# Patient Record
Sex: Female | Born: 1945 | Race: Black or African American | Hispanic: No | Smoking: Never smoker
Health system: Southern US, Community
[De-identification: ages and names within clinical notes are randomized; demographics above are authoritative.]

## PROBLEM LIST (undated history)

## (undated) DIAGNOSIS — N3281 Overactive bladder: Secondary | ICD-10-CM

## (undated) DIAGNOSIS — K219 Gastro-esophageal reflux disease without esophagitis: Secondary | ICD-10-CM

## (undated) DIAGNOSIS — M1990 Unspecified osteoarthritis, unspecified site: Secondary | ICD-10-CM

## (undated) DIAGNOSIS — R42 Dizziness and giddiness: Secondary | ICD-10-CM

## (undated) DIAGNOSIS — I10 Essential (primary) hypertension: Secondary | ICD-10-CM

## (undated) DIAGNOSIS — C801 Malignant (primary) neoplasm, unspecified: Secondary | ICD-10-CM

## (undated) DIAGNOSIS — T7840XA Allergy, unspecified, initial encounter: Secondary | ICD-10-CM

## (undated) HISTORY — DX: Malignant (primary) neoplasm, unspecified: C80.1

## (undated) HISTORY — PX: NEPHRECTOMY: SHX65

## (undated) HISTORY — PX: HAMMER TOE SURGERY: SHX385

## (undated) HISTORY — PX: ABDOMINAL HYSTERECTOMY: SHX81

## (undated) HISTORY — DX: Allergy, unspecified, initial encounter: T78.40XA

## (undated) HISTORY — PX: REPLACEMENT TOTAL KNEE: SUR1224

## (undated) HISTORY — DX: Gastro-esophageal reflux disease without esophagitis: K21.9

## (undated) HISTORY — DX: Overactive bladder: N32.81

## (undated) HISTORY — PX: HEMORRHOIDECTOMY WITH HEMORRHOID BANDING: SHX5633

## (undated) HISTORY — DX: Unspecified osteoarthritis, unspecified site: M19.90

## (undated) HISTORY — DX: Dizziness and giddiness: R42

---

## 2018-03-29 ENCOUNTER — Emergency Department (HOSPITAL_BASED_OUTPATIENT_CLINIC_OR_DEPARTMENT_OTHER): Payer: Medicare HMO

## 2018-03-29 ENCOUNTER — Emergency Department (HOSPITAL_BASED_OUTPATIENT_CLINIC_OR_DEPARTMENT_OTHER)
Admission: EM | Admit: 2018-03-29 | Discharge: 2018-03-29 | Disposition: A | Discharge status: Home / Self Care | Payer: Medicare HMO | Attending: Emergency Medicine | Admitting: Emergency Medicine

## 2018-03-29 ENCOUNTER — Other Ambulatory Visit: Payer: Self-pay

## 2018-03-29 ENCOUNTER — Encounter (HOSPITAL_BASED_OUTPATIENT_CLINIC_OR_DEPARTMENT_OTHER): Payer: Self-pay

## 2018-03-29 DIAGNOSIS — Y92410 Unspecified street and highway as the place of occurrence of the external cause: Secondary | ICD-10-CM | POA: Diagnosis not present

## 2018-03-29 DIAGNOSIS — I10 Essential (primary) hypertension: Secondary | ICD-10-CM | POA: Insufficient documentation

## 2018-03-29 DIAGNOSIS — S2220XA Unspecified fracture of sternum, initial encounter for closed fracture: Secondary | ICD-10-CM | POA: Diagnosis not present

## 2018-03-29 DIAGNOSIS — Y999 Unspecified external cause status: Secondary | ICD-10-CM | POA: Insufficient documentation

## 2018-03-29 DIAGNOSIS — Y939 Activity, unspecified: Secondary | ICD-10-CM | POA: Diagnosis not present

## 2018-03-29 DIAGNOSIS — S299XXA Unspecified injury of thorax, initial encounter: Secondary | ICD-10-CM | POA: Diagnosis present

## 2018-03-29 HISTORY — DX: Essential (primary) hypertension: I10

## 2018-03-29 MED ORDER — OXYCODONE-ACETAMINOPHEN 5-325 MG PO TABS: 1.0000 | ORAL_TABLET | ORAL | 0 refills | Status: DC | PRN

## 2018-03-29 MED ORDER — ACETAMINOPHEN 325 MG PO TABS
650.0000 mg | ORAL_TABLET | Freq: Once | ORAL | Status: AC
  Administered 2018-03-29: 650 mg via ORAL
  Filled 2018-03-29: qty 2

## 2018-03-29 MED ORDER — HYDROCODONE-ACETAMINOPHEN 5-325 MG PO TABS
1.0000 | ORAL_TABLET | Freq: Once | ORAL | Status: AC
  Administered 2018-03-29: 1 via ORAL
  Filled 2018-03-29: qty 1
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
## 2018-04-04 ENCOUNTER — Emergency Department (HOSPITAL_BASED_OUTPATIENT_CLINIC_OR_DEPARTMENT_OTHER): Payer: Medicare HMO

## 2018-04-04 ENCOUNTER — Encounter (HOSPITAL_BASED_OUTPATIENT_CLINIC_OR_DEPARTMENT_OTHER): Payer: Self-pay | Admitting: Emergency Medicine

## 2018-04-04 ENCOUNTER — Emergency Department (HOSPITAL_BASED_OUTPATIENT_CLINIC_OR_DEPARTMENT_OTHER)
Admission: EM | Admit: 2018-04-04 | Discharge: 2018-04-04 | Disposition: A | Discharge status: Home / Self Care | Payer: Medicare HMO | Attending: Emergency Medicine | Admitting: Emergency Medicine

## 2018-04-04 ENCOUNTER — Other Ambulatory Visit: Payer: Self-pay

## 2018-04-04 DIAGNOSIS — S161XXA Strain of muscle, fascia and tendon at neck level, initial encounter: Secondary | ICD-10-CM | POA: Diagnosis not present

## 2018-04-04 DIAGNOSIS — S2220XD Unspecified fracture of sternum, subsequent encounter for fracture with routine healing: Secondary | ICD-10-CM

## 2018-04-04 DIAGNOSIS — I10 Essential (primary) hypertension: Secondary | ICD-10-CM | POA: Diagnosis not present

## 2018-04-04 DIAGNOSIS — Z7982 Long term (current) use of aspirin: Secondary | ICD-10-CM | POA: Insufficient documentation

## 2018-04-04 DIAGNOSIS — Z79899 Other long term (current) drug therapy: Secondary | ICD-10-CM | POA: Diagnosis not present

## 2018-04-04 DIAGNOSIS — Y999 Unspecified external cause status: Secondary | ICD-10-CM | POA: Diagnosis not present

## 2018-04-04 DIAGNOSIS — Y9389 Activity, other specified: Secondary | ICD-10-CM | POA: Insufficient documentation

## 2018-04-04 DIAGNOSIS — Y92410 Unspecified street and highway as the place of occurrence of the external cause: Secondary | ICD-10-CM | POA: Insufficient documentation

## 2018-04-04 DIAGNOSIS — S199XXA Unspecified injury of neck, initial encounter: Secondary | ICD-10-CM | POA: Diagnosis present
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
## 2019-10-24 ENCOUNTER — Other Ambulatory Visit: Payer: Self-pay

## 2019-10-24 ENCOUNTER — Encounter (HOSPITAL_BASED_OUTPATIENT_CLINIC_OR_DEPARTMENT_OTHER): Payer: Self-pay | Admitting: Oncology

## 2019-10-24 ENCOUNTER — Emergency Department (HOSPITAL_BASED_OUTPATIENT_CLINIC_OR_DEPARTMENT_OTHER): Payer: Medicare (Managed Care)

## 2019-10-24 ENCOUNTER — Emergency Department (HOSPITAL_BASED_OUTPATIENT_CLINIC_OR_DEPARTMENT_OTHER)
Admission: EM | Admit: 2019-10-24 | Discharge: 2019-10-24 | Disposition: A | Discharge status: Home / Self Care | Payer: Medicare (Managed Care) | Attending: Emergency Medicine | Admitting: Emergency Medicine

## 2019-10-24 DIAGNOSIS — Z79899 Other long term (current) drug therapy: Secondary | ICD-10-CM | POA: Insufficient documentation

## 2019-10-24 DIAGNOSIS — I10 Essential (primary) hypertension: Secondary | ICD-10-CM | POA: Insufficient documentation

## 2019-10-24 DIAGNOSIS — Z7982 Long term (current) use of aspirin: Secondary | ICD-10-CM | POA: Insufficient documentation

## 2019-10-24 DIAGNOSIS — Z96659 Presence of unspecified artificial knee joint: Secondary | ICD-10-CM | POA: Diagnosis not present

## 2019-10-24 DIAGNOSIS — R0781 Pleurodynia: Secondary | ICD-10-CM | POA: Diagnosis present

## 2019-10-24 MED ORDER — OXYCODONE-ACETAMINOPHEN 5-325 MG PO TABS: 1.0000 | ORAL_TABLET | Freq: Four times a day (QID) | ORAL | 0 refills | Status: DC | PRN

## 2019-10-24 NOTE — ED Triage Notes (Signed)
Pt reports right sided ribcage pain since Thursday.  Was seen by PCP and x-rays taken however has not received results.  Pt states she woke up this AM and had difficulty getting OOB.  Pt reports increased burping and flatulence.

## 2019-10-24 NOTE — ED Provider Notes (Signed)
[place] DEPT [facility] Provider Note: Georgena Spurling, MD, FACEP  CSN: [id number] MRN: [id number] ARRIVAL: 10/24/19 at [place]: [facility]   HISTORY OF PRESENT ILLNESS  10/24/19 5:44 AM Dana Tyler is a 74 y.o. female with right lateral lower rib pain for the past week.  She was seen by her primary care physician and was given omeprazole 3 days ago without relief.  The omeprazole was prescribed because she has had increased belching and flatulence.  The pain is in her ribs and not in her abdomen.  There is no associated rash.  She has had no known injury to her chest.  She is not short of breath although there is some exacerbation of the pain with breathing but primarily with any movement.  She describes the pain as sharp.   Past Medical History:  Diagnosis Date  . Hypertension     Past Surgical History:  Procedure Laterality Date  . ABDOMINAL HYSTERECTOMY    . NEPHRECTOMY    . REPLACEMENT TOTAL KNEE      No family history on file.  Social History   Tobacco Use  . Smoking status: Never Smoker  . Smokeless tobacco: Never Used  Substance Use Topics  . Alcohol use: Never  . Drug use: Never    Prior to Admission medications   Medication Sig Start Date End Date Taking? Authorizing Provider  amLODipine (NORVASC) 2.5 MG tablet Take 2 tablets by mouth daily.  03/23/18   [provider]  AMLODIPINE BESYLATE PO Take 1 tablet by mouth daily.    [provider]  aspirin EC 81 MG tablet Take 1 tablet by mouth daily.    [provider]  estradiol (ESTRACE) 0.1 MG/GM vaginal cream Place 1 g vaginally 3 (three) times a week. 09/22/17   [provider]  fexofenadine (ALLEGRA) 180 MG tablet Take 1 tablet by mouth daily. 01/11/18   [provider]  fluticasone (FLONASE) 50 MCG/ACT nasal spray Place 2 sprays into both nostrils daily. 01/06/18   [provider]  labetalol (NORMODYNE) 300 MG tablet Take  300 mg by mouth 2 (two) times daily. 03/23/18   [provider]  lidocaine (LIDODERM) 5 % Apply 1 patch topically as needed. 03/23/18   [provider]  montelukast (SINGULAIR) 10 MG tablet Take 1 tablet by mouth daily. 04/02/18   [provider]  omeprazole (PRILOSEC) 40 MG capsule Take 1 capsule by mouth daily as needed. 01/04/18   [provider]  oxybutynin (DITROPAN-XL) 10 MG 24 hr tablet Take 10 mg by mouth daily. 03/05/18   [provider]  oxyCODONE-acetaminophen (PERCOCET/ROXICET) 5-325 MG tablet Take 1 tablet by mouth every 6 (six) hours as needed (for rib pain). 10/24/19   Rosy Estabrook, MD  tiZANidine (ZANAFLEX) 2 MG tablet Take 2 tablets by mouth 3 (three) times daily as needed. 03/31/18   [provider]    Allergies Oxycodone   REVIEW OF SYSTEMS  Negative except as noted here or in the History of Present Illness.   PHYSICAL EXAMINATION  Initial Vital Signs Blood pressure (!) 144/72, pulse 81, temperature 97.6 F (36.4 C), temperature source Oral, resp. rate 16, height 5\' 3"  (1.6 m), weight 93.9 kg, SpO2 100 %.  Examination General: Well-developed, well-nourished female in no acute distress; appearance consistent with age of record HENT: normocephalic; atraumatic Eyes: Normal appearance Neck: supple Heart: regular rate and rhythm Lungs: clear to auscultation bilaterally Chest: Right lower lateral rib  tenderness without deformity or crepitus; no herpetiform rash Abdomen: soft; nondistended; nontender; bowel sounds present Extremities: No deformity; full range of motion Neurologic: Awake, alert and oriented; motor function intact in all extremities and symmetric; no facial droop Skin: Warm and dry Psychiatric: Normal mood and affect   RESULTS  Summary of this visit's results, reviewed and interpreted by myself:   EKG Interpretation  Date/Time:    Ventricular Rate:    PR Interval:    QRS Duration:   QT Interval:      QTC Calculation:   R Axis:     Text Interpretation:        Laboratory Studies: No results found for this or any previous visit (from the past 24 hour(s)). Imaging Studies: DG Ribs Unilateral W/Chest Right  Result Date: 10/24/2019 CLINICAL DATA:  Right-sided ribcage pain EXAM: RIGHT RIBS AND CHEST - 3+ VIEW COMPARISON:  None. FINDINGS: No fracture or other bone lesions are seen involving the ribs. There is no evidence of pneumothorax or pleural effusion. Both lungs are clear. Heart size and mediastinal contours are within normal limits. Aortic knob calcifications. IMPRESSION: Negative. Electronically Signed   By: Prudencio Pair M.D.   On: 10/24/2019 06:13    ED COURSE and MDM  Nursing notes, initial and subsequent vitals signs, including pulse oximetry, reviewed and interpreted by myself.  Vitals:   10/24/19 0537 10/24/19 0540  BP: (!) 144/72   Pulse: 81   Resp: 16   Temp: 97.6 F (36.4 C)   TempSrc: Oral   SpO2: 100%   Weight:  93.9 kg  Height:  5\' 3"  (1.6 m)   Medications - No data to display  There is no evidence of rib fracture on radiographs.  Abdominal exam reveals no tenderness in the abdomen itself.  The cause of her rib pain is unclear.  There is no rash to suggest shingles, and she has been symptomatic for a week so she is out of the window for his Valtrex anyway.  We will treat her pain and have her follow-up with her PCP.  PROCEDURES  Procedures   ED DIAGNOSES     ICD-10-CM   1. Rib pain on right side  R07.81        Shanon Rosser, MD 10/24/19 [phone]

## 2020-04-15 IMAGING — CR DG RIBS W/ CHEST 3+V*R*
4 series · 4 of 4 positions shown · non-contrast
Comparison: None.

CLINICAL DATA: Right-sided ribcage pain

EXAM:
RIGHT RIBS AND CHEST - 3+ VIEW

[w chest pa]
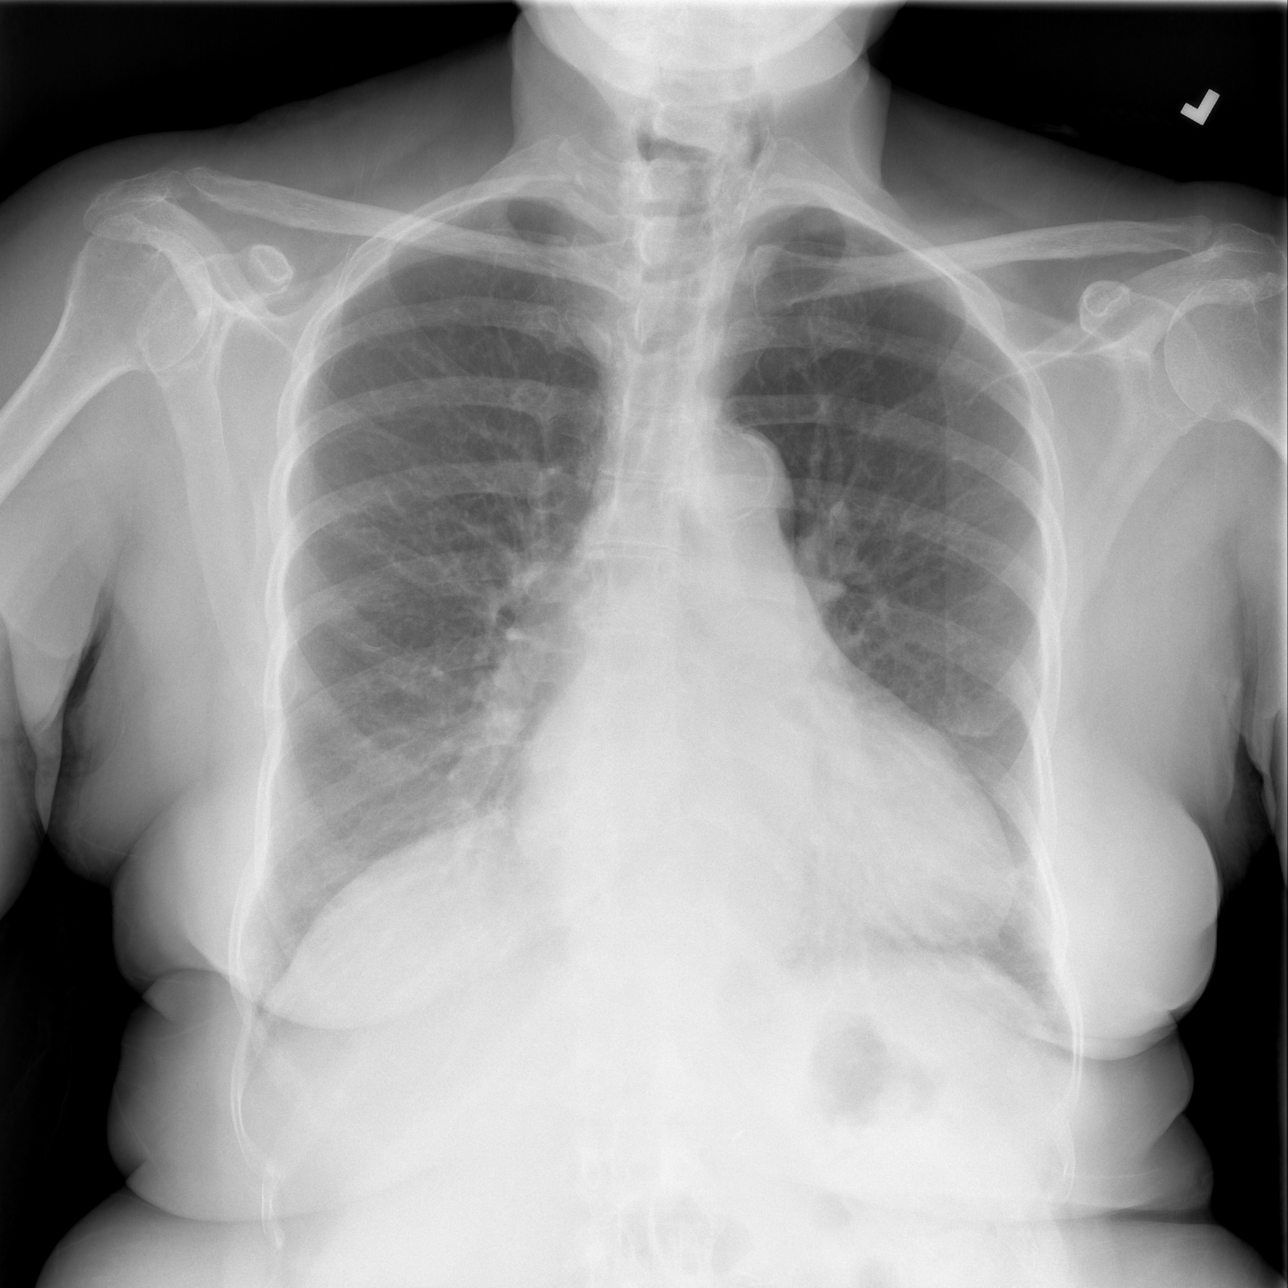

[w ribs ap/pa upper right]
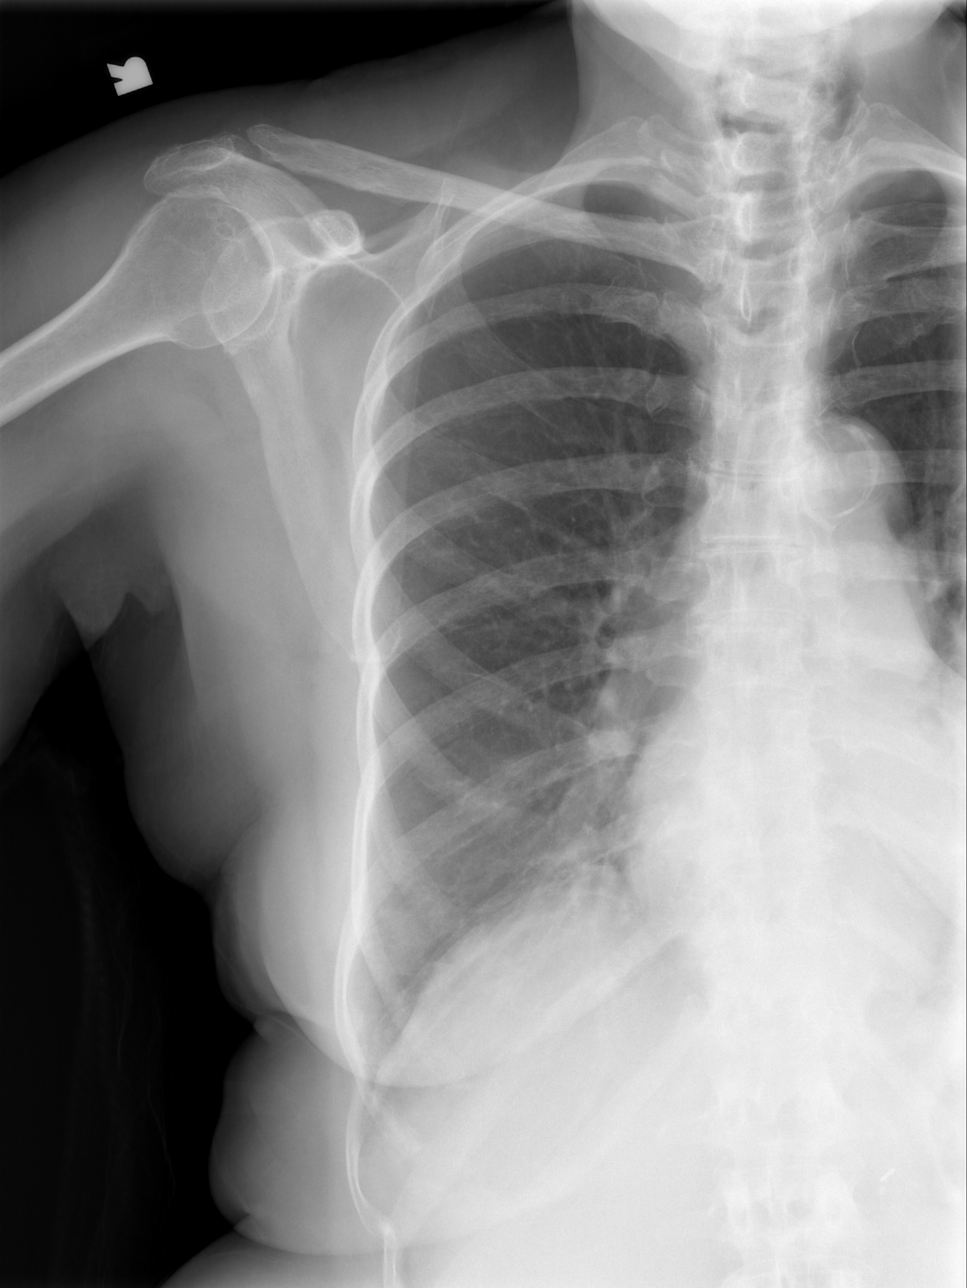

[w ribs ap/pa lower right]
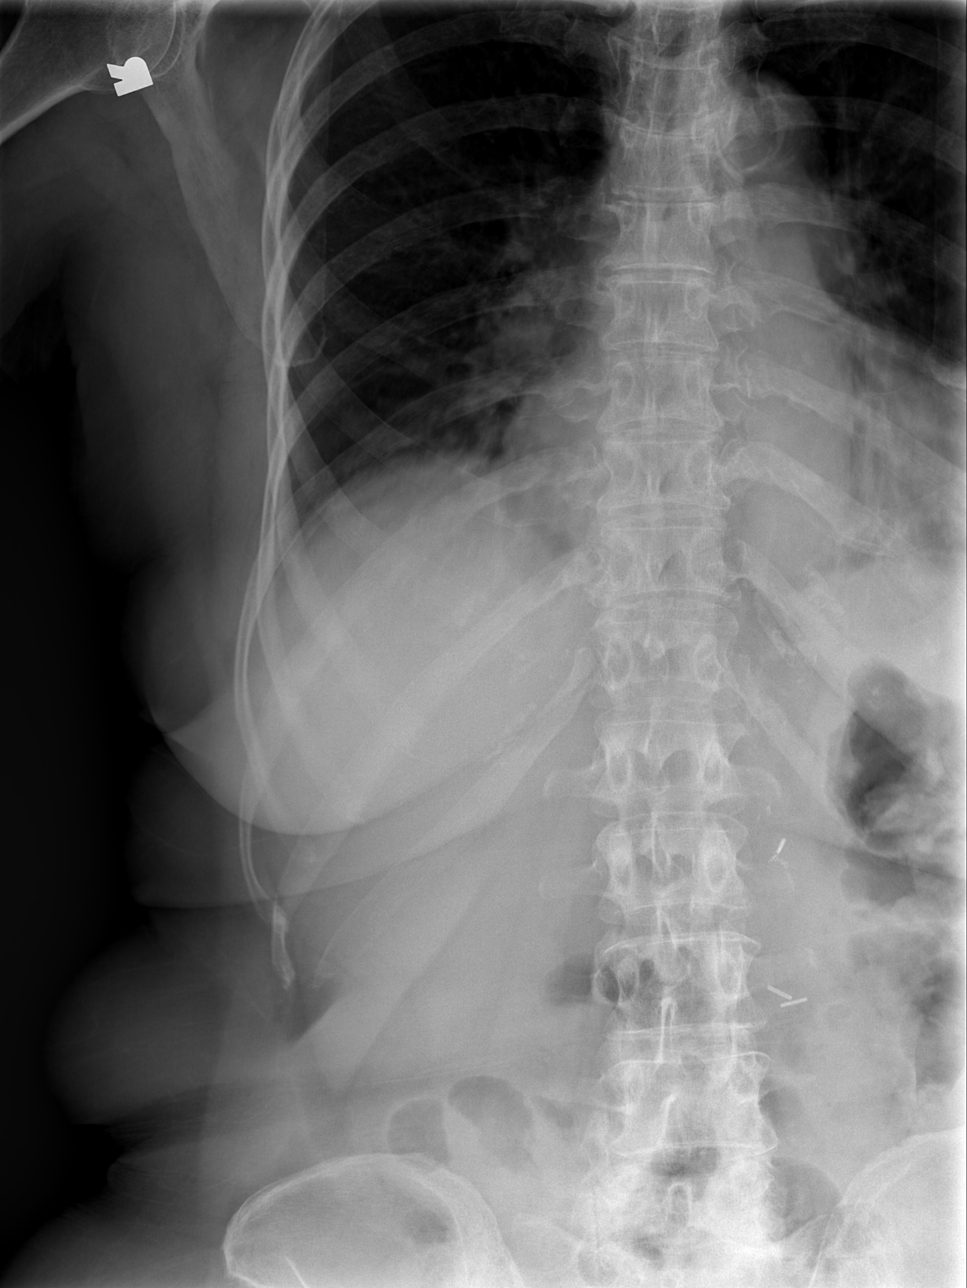

[w ribs oblique right]
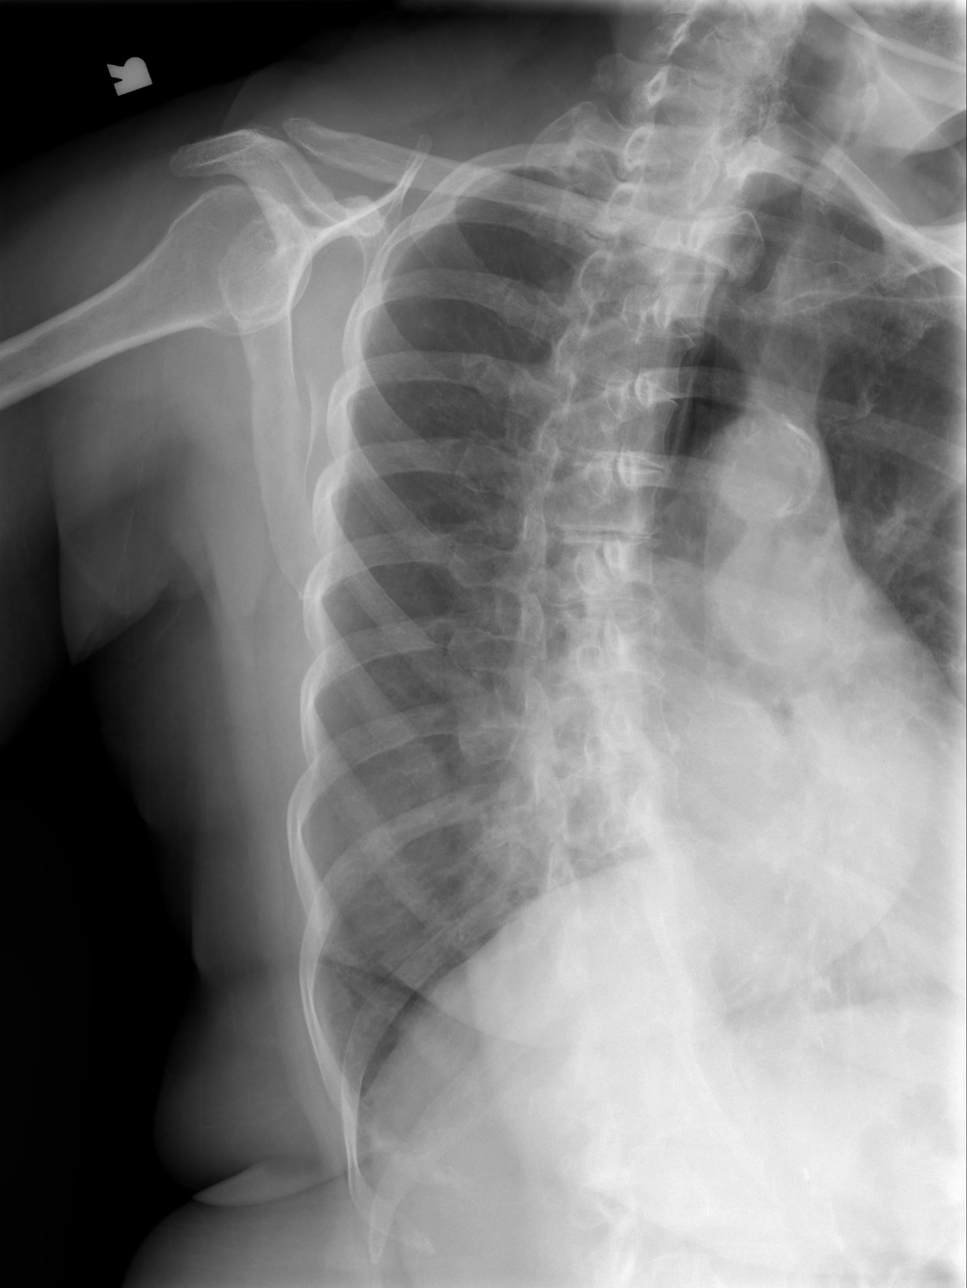

[4 of 4 positions shown; findings below may reference images not displayed]

FINDINGS: No fracture or other bone lesions are seen involving the ribs. There
is no evidence of pneumothorax or pleural effusion. Both lungs are
clear. Heart size and mediastinal contours are within normal limits.
Aortic knob calcifications.
IMPRESSION: Negative.

## 2022-04-18 NOTE — Patient Outreach (Signed)
Aging Gracefully Program  04/18/2022  Dana Tyler 05-Nov-1945 [id number]   [facility] Evaluation Interviewer attempted to call patient on today regarding Aging Gracefully referral. No answer from patient after multiple rings. CMA left confidential voicemail for patient to return call.  Will attempt to call back within 1 week.   [place] Management Assistant [phone]

## 2022-04-22 NOTE — Patient Outreach (Signed)
Aging Gracefully Program  04/22/2022  Dana Tyler 09/19/1946 [id number]   [facility] Evaluation Interviewer attempted to call patient on today regarding Aging Gracefully referral. No answer from patient after multiple rings. CMA left confidential voicemail for patient to return call.  Will attempt to call back within 1 week.   [place] Management Assistant [phone]

## 2022-04-25 ENCOUNTER — Other Ambulatory Visit: Payer: Self-pay

## 2022-04-25 NOTE — Patient Outreach (Signed)
Aging Gracefully Program  04/25/2022  Dana Tyler 22-Mar-1946 [id number]   [facility] Evaluation Interviewer made contact with patient. Aging Gracefully survey completed.   Interviewer will send referral to RN and OT for follow up.   [place] Management Assistant [phone]

## 2022-05-14 ENCOUNTER — Other Ambulatory Visit: Payer: Self-pay | Admitting: Occupational Therapy

## 2022-05-15 NOTE — Patient Outreach (Signed)
Aging Gracefully Program  OT Initial Visit  05/15/2022  Dana Tyler [birth year] [id number]  Visit:  1- Initial Visit  Start Time:  4854 End Time:  1237 Total Minutes:  60  CCAP: Typical Daily Routine: Typical Daily Routine:: Runs errands as needed, exercises or walks at the mall, plays bingo sometimes What Types Of Care Problems Are You Having Throughout The Day?: Difficulty getting into and out of the shower. Getting into and out of the bed What Kind Of Help Do You Receive?: None Do You Think You Need Other Types Of Help?: Not physically What Do You Think Would Make Everyday Life Easier For You?: Improved mobility What Is A Good Day Like?: Getting errands done, feeling good, no vertigo What Is A Bad Day Like?: Feeling off with vertigo, having knee pain, having difficulty getting in and out of tub/bed Do You Have Time For Yourself?: Yes  Patient Reported Equipment: Patient Reported Equipment Currently Used: Bedside Commode, Rolling Walker (at nighttime)   Functional Mobility-Walk A Block: Walk A Block: A Little Difficulty Do You:: No Device/No Assistance Importance Of Learning New Strategies:: Not At All  Functional Mobility-Maintain Balance While Showering: Maintaining Balance While Showering: Moderate Difficulty Do You:: No Device/No Assistance Importance Of Learning New Strategies:: Very Much Other Comments:: Holds towel bar, difficulty if having vertigo flare up Observation: Maintain Balance While Showering: Independent With Pain, Difficulty, Or Use Of Device Safety: Moderate/Extreme Risk Efficiency: Somewhat Intervention: Yes  Functional Mobility-Stooping, Crouching, Kneeling To Retreive Item: Stooping, Crouching, or Kneeling To Retrieve Item: Unable To Do Do You:: Use A Device Importance Of Learning New Strategies:: Moderate Other Comments:: 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 Observation: Dana Tyler, or Kneel: N/O Safety: A Little  Risk Efficiency: Somewhat Intervention: Yes Other Comments:: Provide reacher  Functional Mobility-Bending From Standing Position To Pick Up Clothing Off The Floor: Bending Over From Standing Position To Pick Up Clothing Off The Floor: Moderate Difficulty Do You:: No Device/No Assistance Importance Of Learning New Strategies:: A Little Other Comments:: reacher  Functional Mobility-Reaching For Items Above Shoulder Level: Reaching For Items Above Shoulder Level: A Little Difficulty Do You:: No Device/No Assistance Importance Of Learning New Strategies:: Not At All Other Comments:: Has everything at a low level  Functional Mobility-Climb 1 Flight Of Stairs: Climb 1 Flight Of Stairs: A Little Difficulty Do You:: No Device/No Assistance Importance Of Learning New Strategies:: Not At All Other Comments:: holds rails  Functional Mobility-Move In And Out Of Chair: Move In and Out Of A Chair: No Difficulty Do You:: No Device/No Assistance Importance Of Learning New Strategies:: Not At All  Functional Mobility-Move In And Out Of Bed: Move In and Out Of Bed: Moderate Difficulty Do You:: No Device/No Assistance Importance Of Learning New Strategies:: Very Much Other Comments:: uses chair, has system for moving into/out of bed Observation: Move In and Out Of Bed: Independent With Pain, Difficulty, Or Use Of Device Safety: A Little Risk Efficiency: Somewhat Intervention: Yes Other Comments:: trial bed rail  Functional Mobility-Move In And Out Of Bath/Shower: Move In And Out Of A Bath/Shower: A Lot Of Difficulty Do You:: No Device/No Assistance Importance Of Learning New Strategies:: Very Much Other Comments:: holds windowseal and towel bar Observation: Move In And Out Of Bath/Shower: Independent With Pain, Difficulty, Or Use Of Device Safety: Moderate/Extreme Risk Efficiency: Somewhat Intervention: Yes Other Comments:: conversion kit vs walk-in shower  Functional Mobility-Get On  And Off Toilet: Getting Up From The Floor: Unable To Do Do  You:: N/A- Not Applicable Importance Of Learning New Strategies:: Not At All  Functional Mobility-Into And Out Of Car, Not Including Driving: Into  And Out Of Car, Not Including Driving: A Little Difficulty Do You:: No Device/No Assistance Importance Of Learning New Strategies:: Not At All Other Comments:: takes her time  Activities of Daily Living-Bathing/Showering: ADL-Bathing/Showering: Moderate Difficulty Do You:: No Device/No Assistance Importance Of Learning New Strategies: Moderate Other Comments:: difficulty reaching legs/feet due to balance ADL Observation: Bathing/Showering: N/O Safety: Moderate/Extreme Risk Efficiency: Somewhat Intervention: Yes Other Comments:: needs shower seat and LH sponge  Activities of Daily Living-Personal Hygiene and Grooming: Personal Hygiene and Grooming: No Difficulty Do You:: No Device/No Assistance Importance Of Learning New Strategies: Not At All  Activities of Daily Living-Toilet Hygiene: Toilet Hygiene: No Difficulty Do You:: No Device/No Assistance Importance Of Learning New Strategies: Not At All  Activities of Daily Living-Put On And Take Off Undergarments (Incl. Fasteners): Put On And Take Off Undergarments (Incl. Fasteners): No Difficulty Do You:: No Device/No Assistance Importance Of Learning New Strategies: Not At All  Activities of Daily Living-Put On And Take Off Shirt/Dress/Coat (Incl. Fasteners): Put On And Take Off Shirt/Dress/Coat (Incl. Fasteners): No Difficulty Do You:: No Device/No Assistance Importance Of Learning New Strategies: Not At All  Activities of Daily Living-Put On And Take Off Socks And Shoes: Put On And Take Off Socks And  Shoes: No Difficulty Do You:: No Device/No Assistance Importance Of Learning New Strategies: Not At All  Activities of Daily Living-Feed Self: Feed Self: No Difficulty Do You:: No Device/No Assistance Importance Of  Learning New Strategies: Not At All  Activities of Daily Living-Rest And Sleep: Rest and Sleep: No Difficulty Do You:: No Device/No Assistance Importance Of Learning New Strategies: Not At All  Activities of Daily Living-Sexual Activity: Sexual  Activity: N/A   Instrumental Activities of Daily Living-Light Homemaking (Laundry, Straightening Up, Vacuuming):  Do Light Homemaking (Laundry, Straightening Up, Vacuuming): No Difficulty Do You:: No Device/No Assistance Importance Of Learning New Strategies: Not At All  Instrumental Activities of Daily Living-Making A Bed: Making a Bed: No Difficulty Do You:: No Device/No Assistance Importance Of Learning New Strategies: Not At All  Instrumental Activities of Daily Living-Washing Dishes By Hand While Standing At The Sink: Washing Dishes By Hand While Standing At The Sink: No Difficulty Do You:: No Device/No Assistance Importance Of Learning New Strategies: Not At All  Instrumental Activities of Daily Living-Grocery Shopping: Do Grocery Shopping: No Difficulty Do You:: No Device/No Assistance Importance Of Learning New Strategies: Not At All  Instrumental Activities of Daily Living-Use Telephone: Use Telephone: No Difficulty Do You:: No Device/No Assistance Importance Of Learning New Strategies: Not At All  Instrumental Activities of Daily Living-Financial Management: Financial Management: No Difficulty Do You:: No Device/No Assistance Importance Of Learning New Strategies: Not At All  Instrumental Activities of Daily Living-Medications: Take Medications: No Difficulty Do You:: No Device/No Assistance Importance Of Learning New Strategies: Not At All  Instrumental Activities of Daily Living-Health Management And Maintenance: Health Management & Maintenance: No Difficulty Do You:: No Device/No Assistance Importance Of Learning New Strategies: Not At All  Instrumental Activities of Daily Living-Meal Preparation and  Clean-Up: Meal Preparation and Clean-Up: No Difficulty Do You:: No Device/No Assistance Importance Of Learning New Strategies: Not At All  Instrumental Activities of Daily Living-Provide Care For Others/Pets: Care For Others/Pets: N/A  Instrumental Activities of Daily Living-Take Part In Organized Social Activities: Take Part In Organized Social Activities: No Difficulty Do You:: No  Device/No Assistance Importance Of Learning New Strategies: Not At All  Instrumental Activities of Daily Living-Leisure Participation: Leisure Participation: No Difficulty Do You:: No Device/No Assistance Importance Of Learning New Strategies: Not At All  Instrumental Activities of Daily Living-Employment/Volunteer Activities: Employment/Volunteer Activities: N/A   Readiness To Change Score:  Readiness to Change Score: 8.67  Home Environment Assessment: Outside Home Entry:: steep hill, cement steps with rails in front, cement steps with rail on one side at top step in back Bathroom:: tub/shower combo, handicap height commode, question threshold integrity, no grab bars Smoke/CO2 Detector:: working Investment banker, operational Concerns:: crack in Solicitor, sparking outlets  Patient Education: Education Provided: Yes Education Details: Educated on [employer] and the roles of Special educational needs teacher) Educated: Patient Comprehension: Verbalized Understanding  Goals:  Goals Addressed             This Visit's Progress    Patient Stated       Pt will improve safety and independence getting into and out of shower and performing bathing tasks.      Patient Stated       Patient will get into and out of the bed safely and with improved efficiency using DME as needed.      Patient Stated       Pt will improve ability to pick up items from the floor independently use AE as needed.      Patient Stated       Pt will improve safety getting into and out of the house via the back steps and door.          Post Clinical Reasoning: Clinician View Of Client Situation:: Pt is cautious and actively problem-solves for safety during her daily tasks. Her husband was in SNF for 2 years and this taught her how to improve her safety to avoid falls or preventable health conditions. She demonstrates her completing of functional mobility and strategies for ADL performance. Is open to the program and further action planning.  Client View Of His/Her Situation:: Pt feels she is doing what she can to stay safe in her home. Does feel she could use some modifications to assist with safety. She know when she needs to rest and what tasks might trigger her vertigo and is careful with these tasks.  Next Visit Plan:: Begin goals-provide reacher and practice use   Dana Tyler, OTR/L  [phone] 05/14/22

## 2022-05-30 ENCOUNTER — Other Ambulatory Visit: Payer: Self-pay

## 2022-05-30 NOTE — Patient Outreach (Signed)
Aging Gracefully Program  05/30/2022  Dana Tyler May 22, 1946 [id number]   Placed call to patient and explained reason for call. Offered Aging Gracefully RN home visit for 06/03/2022  at 1130 and patient accepted. Confirmed address.  [name] RN, BSN, Careers information officer for [employer] Mobile: [phone]

## 2022-06-03 ENCOUNTER — Other Ambulatory Visit: Payer: Self-pay

## 2022-06-03 NOTE — Patient Outreach (Signed)
Aging Gracefully Program  RN Visit  06/03/2022  Dana Tyler 04-03-1946 [id number]  Visit:   Rn home visit #1  Start Time:   1135 End Time:   1245 Total Minutes:   70  Readiness To Change Score:     Universal RN Interventions: Calendar Distribution: Yes Exercise Review: No Medication Changes: No Mood: Yes Pain: Yes PCP Advocacy/Support: No Fall Prevention: Yes Incontinence: Yes Clinician View Of Client Situation: Home neat and clean.  Patient pleasant and eager for the AG program. Patient [place].Patient is self managing her health well. She is very cautious about falls and Engineer, mining Of His/Her Situation: Patient reports that she has good family support. Currently she is assisting with the care of her sister who has dementia.  Reports that stresses her out.  She sees her sister 2 times per week and takes her to the store.  Patient reports that she goes to the mall to walk several times per week. Patient checks her blood pressure on occasion. Reports she takes all her medications as prescribed. She is able to Brielynn Sekula for herself without difficulty. Reports she has someone to mow her yard but otherwise manages her home independently.  Healthcare Provider Communication: Did Higher education careers adviser With [employer] Provider?: No According to Client, Did PCP Report Communication With An Aging Gracefully RN?: No  Clinician View of Client Situation: Clinician View Of Client Situation: Home neat and clean.  Patient pleasant and eager for the AG program. Patient [place].Patient is self managing her health well. She is very cautious about falls and security Client's View of His/Her Situation: Client View Of His/Her Situation: Patient reports that she has good family support. Currently she is assisting with the care of her sister who has dementia.  Reports that stresses her out.  She sees her sister 2 times per week and takes her to the store.  Patient  reports that she goes to the mall to walk several times per week. Patient checks her blood pressure on occasion. Reports she takes all her medications as prescribed. She is able to Braheem Tomasik for herself without difficulty. Reports she has someone to mow her yard but otherwise manages her home independently.  Medication Assessment: Do You Have Any Problems Paying For Medications?: No Where Does Client Store Medications?: Kitchen Table Can Client Read Pill Bottles?: Yes Does Client Use A Pillbox?: Yes Does Anyone Assist Client In Filling Pillbox?: No Does Anyone Assist Client In Taking Medications?: No Do You Take Vitamin D?: Yes Does Client Have Any Questions Or Concerns About Medictions?: No Is Client Complaining Of Any Symptoms That Could Be Side Effects To Medications?: No Any Possible Changes In Medication Regimen?: No  Outpatient Encounter Medications as of 06/03/2022  Medication Sig Note   amLODipine (NORVASC) 2.5 MG tablet Take 2 tablets by mouth daily.  06/03/2022: Takes '5mg'$  once a day. Breaks a 10 mg in half.   ascorbic acid (VITAMIN C) 500 MG tablet Take 500 mg by mouth daily.    aspirin EC 81 MG tablet Take 1 tablet by mouth daily.    Bacillus Coagulans-Inulin (PROBIOTIC) 1-250 BILLION-MG CAPS Take 1 tablet by mouth daily.    Cholecalciferol (VITAMIN D3) 50 MCG (2000 UT) capsule Take 2,000 Units by mouth every other day.    fluticasone (FLONASE) 50 MCG/ACT nasal spray Place 2 sprays into both nostrils daily.    labetalol (NORMODYNE) 300 MG tablet Take 300 mg by mouth 2 (two) times daily.  lidocaine (LIDODERM) 5 % Apply 1 patch topically as needed.    loratadine (CLARITIN) 10 MG tablet Take 10 mg by mouth daily.    meclizine (ANTIVERT) 12.5 MG tablet Take 12.5 mg by mouth 3 (three) times daily as needed for dizziness.    montelukast (SINGULAIR) 10 MG tablet Take 1 tablet by mouth daily.    MULTIPLE VITAMIN PO Take 1 tablet by mouth daily.    omeprazole (PRILOSEC) 40 MG capsule Take 1  capsule by mouth daily as needed. Takes 20 mg once a day    oxybutynin (DITROPAN-XL) 10 MG 24 hr tablet Take 5 mg by mouth 2 (two) times daily. Takes a half tablet in the mornings and a whole tablet at night    AMLODIPINE BESYLATE PO Take 1 tablet by mouth daily. (Patient not taking: Reported on 06/03/2022)    estradiol (ESTRACE) 0.1 MG/GM vaginal cream Place 1 g vaginally 3 (three) times a week. (Patient not taking: Reported on 06/03/2022)    fexofenadine (ALLEGRA) 180 MG tablet Take 1 tablet by mouth daily. (Patient not taking: Reported on 06/03/2022)    oxyCODONE-acetaminophen (PERCOCET/ROXICET) 5-325 MG tablet Take 1 tablet by mouth every 6 (six) hours as needed (for rib pain). (Patient not taking: Reported on 06/03/2022)    tiZANidine (ZANAFLEX) 2 MG tablet Take 2 tablets by mouth 3 (three) times daily as needed. (Patient not taking: Reported on 06/03/2022)    No facility-administered encounter medications on file as of 06/03/2022.    OT Update: pending [employer] assessment  Session Summary: Patient doing very well. Is cautious of falls and security.   Goals Addressed               This Visit's Progress     RN Aging Gracefully Goal: (pt-stated)        Goal:  Patient will report improved strength in the next 90 days.  06/03/2022 Assessment: Patient reports that she wants to work on her strength. Reports she has difficulty with hand strength and overall strength.  Interventions: encouraged patient to get a stress ball and exercise her hands. Patient has pedals in her bedroom but she states the pedal move all over the floor. Encouraged patient to get something that is non slip and see if that will work so she can use her pedals. Encouraged self liner or  carpet grippers.   Will plan to bring home exercise plan for next home visit. Encouraged patient to continue to walk at the mall. Reviewed fall prevention especially with incline of driveway and coming up front steps.  Encouraged patient to the back door to avoid the incline and steps.  Provided THN tool to keep up with appointments and BP readings.  Plan: next home visit planned for 06/24/2022 at 1130.  [name] RN, BSN, Careers information officer for [employer] Mobile: [phone]        [name] RN, BSN, Careers information officer for [employer] Mobile: [phone]

## 2022-06-03 NOTE — Patient Instructions (Signed)
Visit Information  Thank you for taking time to visit with me today. Please don't hesitate to contact me if I can be of assistance to you before our next scheduled telephone appointment.  Following are the goals we discussed today:   Goals Addressed               This Visit's Progress     RN Aging Gracefully Goal: (pt-stated)        Goal:  Patient will report improved strength in the next 90 days.  06/03/2022 Assessment: Patient reports that she wants to work on her strength. Reports she has difficulty with hand strength and overall strength.  Interventions: encouraged patient to get a stress ball and exercise her hands. Patient has pedals in her bedroom but she states the pedal move all over the floor. Encouraged patient to get something that is non slip and see if that will work so she can use her pedals. Encouraged self liner or  carpet grippers.   Will plan to bring home exercise plan for next home visit. Encouraged patient to continue to walk at the mall. Reviewed fall prevention especially with incline of driveway and coming up front steps. Encouraged patient to the back door to avoid the incline and steps.  Provided THN tool to keep up with appointments and BP readings.  Plan: next home visit planned for 06/24/2022 at 1130.  [name] RN, BSN, CEN RN Case Freight forwarder for [employer] Mobile: [phone]          Our next appointment is  in person  on 06/24/2022 at 1130  Please call the care guide team at [phone] if you need to cancel or reschedule your appointment.   If you are experiencing a Mental Health or [place] or need someone to talk to, please call the Suicide and Crisis Lifeline: 988 call the [place] National Suicide Prevention Lifeline: 580-548-4539 or TTY: (703)661-1438 TTY 5082916996) to talk to a trained counselor call 1-800-273-TALK (toll free, 24 hour hotline) go to [facility] [address] [place] [phone]) call 911   The patient verbalized understanding of instructions, educational materials, and care plan provided today and agreed to receive a mailed copy of patient instructions, educational materials, and care plan.   [name] RN, BSN, Careers information officer for [employer] Mobile: [phone]

## 2022-06-27 ENCOUNTER — Other Ambulatory Visit: Payer: Self-pay | Admitting: Occupational Therapy

## 2022-06-27 ENCOUNTER — Telehealth: Payer: Self-pay

## 2022-06-27 NOTE — Patient Outreach (Signed)
Aging Gracefully Program  06/27/2022  Starletta Houchin 05/14/1946 [id number]   Placed call to patient and rescheduled home visit for 06/28/2022  at 1130am  Edgar, BSN, CEN RN Case Freight forwarder for [place] Mobile: [phone]

## 2022-06-28 ENCOUNTER — Other Ambulatory Visit: Payer: Self-pay

## 2022-06-28 NOTE — Patient Instructions (Signed)
Visit Information  Thank you for taking time to visit with me today. Please don't hesitate to contact me if I can be of assistance to you before our next scheduled telephone appointment.  Following are the goals we discussed today:   Goals Addressed               This Visit's Progress     RN Aging Gracefully Goal: (pt-stated)        Goal:  Patient will report improved strength in the next 90 days.  06/03/2022 Assessment: Patient reports that she wants to work on her strength. Reports she has difficulty with hand strength and overall strength.  Interventions: encouraged patient to get a stress ball and exercise her hands. Patient has pedals in her bedroom but she states the pedal move all over the floor. Encouraged patient to get something that is non slip and see if that will work so she can use her pedals. Encouraged self liner or  carpet grippers.   Will plan to bring home exercise plan for next home visit. Encouraged patient to continue to walk at the mall. Reviewed fall prevention especially with incline of driveway and coming up front steps. Encouraged patient to the back door to avoid the incline and steps.  Provided THN tool to keep up with appointments and BP readings.  Plan: next home visit planned for 06/24/2022 at 1130.  [name] RN, BSN, CEN RN Case Freight forwarder for [facility] [employer] Mobile: [phone]    06/28/2022 Assessment:  using stress balls to improve strength in hands. Reports staying active.  Interventions: reviewed home exercise plan and encouraged daily use.  Encouraged patient to continue to do hand and finger exercises.   Plan:home visit in 1 month  [name] RN, BSN, CEN RN Case Freight forwarder for [place] Mobile: [phone]          Our next appointment is  in person  on 07/26/2022 at 1130  Please call the care guide team at [phone] if you need to cancel or reschedule your appointment.    If you are experiencing a Mental Health or [place] or need someone to talk to, please call the Suicide and Crisis Lifeline: 988 call the [place] National Suicide Prevention Lifeline: 641-327-4267 or TTY: 386-618-4342 TTY (904)138-7496) to talk to a trained counselor call 1-800-273-TALK (toll free, 24 hour hotline) go to [facility] [place] ([phone])   The patient verbalized understanding of instructions, educational materials, and care plan provided today and agreed to receive a mailed copy of patient instructions, educational materials, and care plan.   [name] RN, BSN, Careers information officer for [employer] Mobile: [phone]

## 2022-06-28 NOTE — Patient Outreach (Signed)
Aging Gracefully Program  RN Visit  06/28/2022  Kathlynn Swofford 1946-04-15 [id number]  Visit:   RN  Home visit #2  Start Time:   1130 End Time:   8502 Total Minutes:   60  Readiness To Change Score:     Universal RN Interventions: Calendar Distribution: Yes Exercise Review: Yes Medications: Yes Medication Changes: Yes Mood: Yes Pain: Yes PCP Advocacy/Support: No Fall Prevention: Yes Incontinence: Yes Clinician View Of Client Situation: Home neat and clean. well dressed. Awake and alert.. Ambulating without difficulty. Client View Of His/Her Situation: patient reports that she is doing well. Reports no falls. reports she woke up with a crick in her neck, using hot packs. No changes to medications.  reports stiffness in her neck.  No falls. Has not gotten pedal secured yet.  Healthcare Provider Communication: Did Higher education careers adviser With [employer] Provider?: No According to Client, Did PCP Report Communication With An Aging Gracefully RN?: No  Clinician View of Client Situation: Clinician View Of Client Situation: Home neat and clean. well dressed. Awake and alert.. Ambulating without difficulty. Client's View of His/Her Situation: Client View Of His/Her Situation: patient reports that she is doing well. Reports no falls. reports she woke up with a crick in her neck, using hot packs. No changes to medications.  reports stiffness in her neck.  No falls. Has not gotten pedal secured yet.  Medication Assessment: denies any changes    OT Update: pending home modifications  Session Summary: doing well. No new new problems. Continues to walk 2-3 times per week to walk.    Goals Addressed               This Visit's Progress     RN Aging Gracefully Goal: (pt-stated)        Goal:  Patient will report improved strength in the next 90 days.  06/03/2022 Assessment: Patient reports that she wants to work on her strength. Reports she has difficulty with hand strength and overall  strength.  Interventions: encouraged patient to get a stress ball and exercise her hands. Patient has pedals in her bedroom but she states the pedal move all over the floor. Encouraged patient to get something that is non slip and see if that will work so she can use her pedals. Encouraged self liner or  carpet grippers.   Will plan to bring home exercise plan for next home visit. Encouraged patient to continue to walk at the mall. Reviewed fall prevention especially with incline of driveway and coming up front steps. Encouraged patient to the back door to avoid the incline and steps.  Provided THN tool to keep up with appointments and BP readings.  Plan: next home visit planned for 06/24/2022 at 1130.  [name] RN, BSN, CEN RN Case Freight forwarder for [facility] [employer] Mobile: [phone]    06/28/2022 Assessment:  using stress balls to improve strength in hands. Reports staying active.  Interventions: reviewed home exercise plan and encouraged daily use.  Encouraged patient to continue to do hand and finger exercises.   Plan:home visit in 1 month  [name] RN, BSN, Careers information officer for [place] Mobile: [phone]         [name] RN, BSN, Careers information officer for [employer] Mobile: [phone]

## 2022-06-29 NOTE — Patient Outreach (Signed)
Aging Gracefully Program  OT Follow-Up Visit  06/29/2022  Dana Tyler [birth year] [id number]  Visit:  2- Second Visit  Start Time:  9675 End Time:  9163 Total Minutes:  35        Readiness to Change Score :  Readiness to Change Score: 9.33    Durable Medical Equipment: Adaptive Equipment: Bed Rail Adaptive Equipment Distribution Date: 06/27/22  Patient Education: Education Provided: Yes Education Details: Educated on Production manager) Educated: Patient Comprehension: Verbalized Understanding, Returned Demonstration  Goals:   Goals Addressed             This Visit's Progress    COMPLETED: Patient Stated       Patient will get into and out of the bed safely and with improved efficiency using DME as needed.   ACTION PLANNING - FUNCTIONAL MOBILITY Target Problem Area: Getting in and out of bed  Why Problem May Occur:  -high bed -no rail -mobility limitations    Target Goal: Patient will get into and out of the bed safely and with improved efficiency using DME as needed.    STRATEGIES Saving Your Energy: DO: DON'T:  Take breaks    Remove tripping hazards     Modifying your home environment and making it safe: DO: DON'T:  Install bedrail Hold onto unsafe surfaces  Remove or strongly secure throw rugs    Provide adequate lighting Use dim lights or lights that cast a lot of shadows  Simplifying the way you set up tasks or daily routines: DO: DON'T:  Move slowly Rush during transfers or walking    Practice It is important to practice the strategies so we can determine if they will be effective in helping to reach your goal. Follow these specific recommendations: Use a bedrail 2.   Don't rush 3.   Have adequate light for getting up/down from bed  If a strategy does not work the first time, try it again and again (and maybe again). We may make some changes over the next few sessions, based on how they work.  [name], OTR/L   [phone]           Post Clinical Reasoning: Client Action (Goal) One Interventions: Patient will get into and out of the bed safely and with improved efficiency using DME as needed. Did Client Try?: Yes Targeted Problem Area Status: A Lot Better  Clinician View Of Client Situation:: Pt is actively brainstorming and action planning to be safe in her home. She has been meal planning for the week and has been going to walk at least 3x a week.  Client View Of His/Her Situation:: Pt is excited about installing her bedrail, states she tries to be as safe as possible and wants to age in her house for a long time. She has night lights available and has everything she might need at night beside her bed.  Next Visit Plan:: Provide reacher and practice and review getting in and out of the house if modifications are completed   [name], OTR/L  [phone] 06/29/22

## 2022-07-26 ENCOUNTER — Other Ambulatory Visit: Payer: Self-pay

## 2022-07-26 NOTE — Patient Outreach (Signed)
Aging Gracefully Program  RN Visit  07/26/2022  Dana Tyler 1946-04-16 [id number]  Visit:   RN home visit #3  Start Time:   1130 End Time:   1230 Total Minutes:   60  Readiness To Change Score:     Universal RN Interventions: Calendar Distribution: Yes Exercise Review: Yes Medications: Yes Medication Changes: Yes Mood: Yes Pain: Yes PCP Advocacy/Support: Yes Fall Prevention: Yes Incontinence: Yes Clinician View Of Client Situation: Daughter present for the beginning of home visit. Patient well dressed.  Home neat and clean.  Ambulating without any difficultly Client View Of His/Her Situation: Patient reports to me that she is very happy. Reports she has signed her contracts and is happy to have work begin soon.  She reports no recent falls. No changes in medications. States that she is stronger with the home exercise program.  Has no complaints today.  Healthcare Provider Communication: Did Higher education careers adviser With [employer] Provider?: No According to Client, Did PCP Report Communication With An Aging Gracefully RN?: No  Clinician View of Client Situation: Clinician View Of Client Situation: Daughter present for the beginning of home visit. Patient well dressed.  Home neat and clean.  Ambulating without any difficultly Client's View of His/Her Situation: Client View Of His/Her Situation: Patient reports to me that she is very happy. Reports she has signed her contracts and is happy to have work begin soon.  She reports no recent falls. No changes in medications. States that she is stronger with the home exercise program.  Has no complaints today.  Medication Assessment:denies any changes to medications    OT Update: pending start of home modifications  Session Summary: Patient is doing very well and appears happy.    Goals Addressed               This Visit's Progress     RN Aging Gracefully Goal: (pt-stated)        Goal:  Patient will report improved strength  in the next 90 days.  06/03/2022 Assessment: Patient reports that she wants to work on her strength. Reports she has difficulty with hand strength and overall strength.  Interventions: encouraged patient to get a stress ball and exercise her hands. Patient has pedals in her bedroom but she states the pedal move all over the floor. Encouraged patient to get something that is non slip and see if that will work so she can use her pedals. Encouraged self liner or  carpet grippers.   Will plan to bring home exercise plan for next home visit. Encouraged patient to continue to walk at the mall. Reviewed fall prevention especially with incline of driveway and coming up front steps. Encouraged patient to the back door to avoid the incline and steps.  Provided THN tool to keep up with appointments and BP readings.  Plan: next home visit planned for 06/24/2022 at 1130.  [name] RN, BSN, CEN RN Case Freight forwarder for [facility] Mobile: [phone]    06/28/2022 Assessment:  using stress balls to improve strength in hands. Reports staying active.  Interventions: reviewed home exercise plan and encouraged daily use.  Encouraged patient to continue to do hand and finger exercises.   Plan:home visit in 1 month  [name] RN, BSN, CEN RN Case Freight forwarder for [facility] Mobile: [phone]    07/26/2022 Assessment: Patient reports she is feeling stronger doing her exercises. Reports no pain today. Reports she does her exercises daily.  Interventions; Encouraged  patient continue to do her home exercise program.   Plan: next home visit planned for 08/23/2022  [name], RN, BSN, CEN [place] Coordinator [phone]        [name] RN, BSN, CEN RN Case Freight forwarder for [employer] Mobile: [phone]

## 2022-07-26 NOTE — Patient Instructions (Signed)
Visit Information  Thank you for taking time to visit with me today. Please don't hesitate to contact me if I can be of assistance to you before our next scheduled telephone appointment.  Following are the goals we discussed today:   Goals Addressed               This Visit's Progress     RN Aging Gracefully Goal: (pt-stated)        Goal:  Patient will report improved strength in the next 90 days.  06/03/2022 Assessment: Patient reports that she wants to work on her strength. Reports she has difficulty with hand strength and overall strength.  Interventions: encouraged patient to get a stress ball and exercise her hands. Patient has pedals in her bedroom but she states the pedal move all over the floor. Encouraged patient to get something that is non slip and see if that will work so she can use her pedals. Encouraged self liner or  carpet grippers.   Will plan to bring home exercise plan for next home visit. Encouraged patient to continue to walk at the mall. Reviewed fall prevention especially with incline of driveway and coming up front steps. Encouraged patient to the back door to avoid the incline and steps.  Provided THN tool to keep up with appointments and BP readings.  Plan: next home visit planned for 06/24/2022 at 1130.  [name] RN, BSN, CEN RN Case Freight forwarder for [facility] Mobile: [phone]    06/28/2022 Assessment:  using stress balls to improve strength in hands. Reports staying active.  Interventions: reviewed home exercise plan and encouraged daily use.  Encouraged patient to continue to do hand and finger exercises.   Plan:home visit in 1 month  [name] RN, BSN, CEN RN Case Freight forwarder for [facility] Mobile: [phone]    07/26/2022 Assessment: Patient reports she is feeling stronger doing her exercises. Reports no pain today. Reports she does her exercises daily.  Interventions;  Encouraged patient continue to do her home exercise program.   Plan: next home visit planned for 08/23/2022  [name], RN, BSN, CEN [place] Coordinator [phone]          Our next appointment is  in person  on 08/23/2022 at 36  Please call the care guide team at [phone] if you need to cancel or reschedule your appointment.   If you are experiencing a Mental Health or [place] or need someone to talk to, please call the Suicide and Crisis Lifeline: 988 call the [place] National Suicide Prevention Lifeline: (925) 763-3564 or TTY: 281 010 5285 TTY 832-862-3346) to talk to a trained counselor call 1-800-273-TALK (toll free, 24 hour hotline) go to [facility] [address] [place] [phone]) call 911   The patient verbalized understanding of instructions, educational materials, and care plan provided today and agreed to receive a mailed copy of patient instructions, educational materials, and care plan.   [name] RN, BSN, Careers information officer for [employer] Mobile: [phone]

## 2022-08-24 ENCOUNTER — Telehealth: Payer: Self-pay

## 2022-08-24 NOTE — Patient Outreach (Signed)
Aging Gracefully Program  08/24/2022  Dana Tyler 07-Nov-1945 [id number]   Patient called on 08/22/2022 and cancelled her appointment for 08/23/2022. Sister in the hospital.   I returned call and rescheduled patient for 09/06/2022   [name] RN, BSN, CEN RN Case Manager for [employer] Mobile: [phone]

## 2022-09-06 ENCOUNTER — Other Ambulatory Visit: Payer: Self-pay

## 2022-09-06 NOTE — Patient Instructions (Signed)
Visit Information  Thank you for taking time to visit with me today. Please don't hesitate to contact me.  Following are the goals we discussed today:   Goals Addressed               This Visit's Progress     COMPLETED: RN Aging Gracefully Goal: (pt-stated)        Goal:  Patient will report improved strength in the next 90 days.  06/03/2022 Assessment: Patient reports that she wants to work on her strength. Reports she has difficulty with hand strength and overall strength.  Interventions: encouraged patient to get a stress ball and exercise her hands. Patient has pedals in her bedroom but she states the pedal move all over the floor. Encouraged patient to get something that is non slip and see if that will work so she can use her pedals. Encouraged self liner or  carpet grippers.   Will plan to bring home exercise plan for next home visit. Encouraged patient to continue to walk at the mall. Reviewed fall prevention especially with incline of driveway and coming up front steps. Encouraged patient to the back door to avoid the incline and steps.  Provided THN tool to keep up with appointments and BP readings.  Plan: next home visit planned for 06/24/2022 at 1130.  [name] RN, BSN, CEN RN Case Freight forwarder for [facility] Mobile: [phone]    06/28/2022 Assessment:  using stress balls to improve strength in hands. Reports staying active.  Interventions: reviewed home exercise plan and encouraged daily use.  Encouraged patient to continue to do hand and finger exercises.   Plan:home visit in 1 month  [name] RN, BSN, CEN RN Case Freight forwarder for [place] Mobile: [phone]    07/26/2022 Assessment: Patient reports she is feeling stronger doing her exercises. Reports no pain today. Reports she does her exercises daily.  Interventions; Encouraged patient continue to do her home exercise program.   Plan: next  home visit planned for 08/23/2022  [name], RN, BSN, CEN [place] Coordinator [phone]   09/06/2022 Assessment:   Strength has improved.  Has been doing exercise. Grips are better.   Interventions:  encouraged patient to continue to do home exercise plan.   Plan: goals met.   [name] RN, BSN, CEN RN Case Freight forwarder for [place] Mobile: [phone]          If you are experiencing a Mental Health or [place] or need someone to talk to, please call the Suicide and Crisis Lifeline: 988 call the [place] National Suicide Prevention Lifeline: 403-856-2049 or TTY: 319 138 8395 TTY 220-311-6127) to talk to a trained counselor call 1-800-273-TALK (toll free, 24 hour hotline) go to [facility] [address], [place] [phone]) call 911   The patient verbalized understanding of instructions, educational materials, and care plan provided today and agreed to receive a mailed copy of patient instructions, educational materials, and care plan.   [name] RN, BSN, Careers information officer for [employer] Mobile: [phone]

## 2022-09-06 NOTE — Patient Outreach (Signed)
Aging Gracefully Program  RN Visit  09/06/2022  Dana Tyler 25-Jun-1946 [id number]  Visit:  RN Visit Number: 4- Fourth Visit  RN TIME CALCULATION: Start TIme:  RN Start Time Calculation: 7035 End Time:  RN Stop Time Calculation: 0093 Total Minutes:  RN Time Calculation: 45  Readiness To Change Score:     Universal RN Interventions: Calendar Distribution: Yes Exercise Review: Yes Medications: Yes Mood: Yes Pain: Yes PCP Advocacy/Support: No Fall Prevention: Yes Incontinence: Yes Clinician View Of Client Situation: Home neat and clean.  Well dressed.   Ambulatory with no assistive devices. Client View Of His/Her Situation: Patient reports that she is having a hard time taking care of her sister.   Sister was in the hospital for 15 days and is non ambulatory.   Patient is stressed out due to her sisters condition.  Healthcare Provider Communication: none    Clinician View of Client Situation: Clinician View Of Client Situation: Home neat and clean.  Well dressed.   Ambulatory with no assistive devices. Client's View of His/Her Situation: Client View Of His/Her Situation: Patient reports that she is having a hard time taking care of her sister.   Sister was in the hospital for 15 days and is non ambulatory.   Patient is stressed out due to her sisters condition.  Medication Assessment:  no changes in medications    OT Update: pending home modification.  Session Summary: doing well.  Patient reports difficulty with dealing with her sisters illness.    Goals Addressed               This Visit's Progress     RN Aging Gracefully Goal: (pt-stated)        Goal:  Patient will report improved strength in the next 90 days.  06/03/2022 Assessment: Patient reports that she wants to work on her strength. Reports she has difficulty with hand strength and overall strength.  Interventions: encouraged patient to get a stress ball and exercise her hands. Patient has pedals in her  bedroom but she states the pedal move all over the floor. Encouraged patient to get something that is non slip and see if that will work so she can use her pedals. Encouraged self liner or  carpet grippers.   Will plan to bring home exercise plan for next home visit. Encouraged patient to continue to walk at the mall. Reviewed fall prevention especially with incline of driveway and coming up front steps. Encouraged patient to the back door to avoid the incline and steps.  Provided THN tool to keep up with appointments and BP readings.  Plan: next home visit planned for 06/24/2022 at 1130.  [name] RN, BSN, CEN RN Case Freight forwarder for [place] [employer] Mobile: [phone]    06/28/2022 Assessment:  using stress balls to improve strength in hands. Reports staying active.  Interventions: reviewed home exercise plan and encouraged daily use.  Encouraged patient to continue to do hand and finger exercises.   Plan:home visit in 1 month  [name] RN, BSN, CEN RN Case Freight forwarder for [place] Mobile: [phone]    07/26/2022 Assessment: Patient reports she is feeling stronger doing her exercises. Reports no pain today. Reports she does her exercises daily.  Interventions; Encouraged patient continue to do her home exercise program.   Plan: next home visit planned for 08/23/2022  [name], RN, BSN, CEN [place] Coordinator [phone]   09/06/2022 Assessment:   Strength has improved.  Has been doing exercise. Grips are better.   Interventions:  encouraged patient to continue to do home exercise plan.   Plan: goals met.   [name] RN, BSN, Careers information officer for [employer] Mobile: [phone]        [name] RN, BSN, Careers information officer for [employer] Mobile: [phone]

## 2022-09-14 ENCOUNTER — Other Ambulatory Visit: Payer: Self-pay | Admitting: Occupational Therapy

## 2022-09-14 NOTE — Patient Outreach (Signed)
Aging Gracefully Program  OT Follow-Up Visit  09/14/2022  Dana Tyler December 07, 1945 [id number]  Visit:  3- Third Visit  Start Time:  0175 End Time:  1025 Total Minutes:  25  Readiness to Change Score :  Readiness to Change Score: 9.33    Durable Medical Equipment: Adaptive Equipment: Reacher Adaptive Equipment Distribution Date: 09/14/22  Patient Education: Education Provided: Yes Education Details: Educated on Tourist information centre manager) Educated: Patient, Child(ren) Comprehension: Verbalized Understanding, Returned Demonstration  Goals:   Goals Addressed             This Visit's Progress    COMPLETED: Patient Stated       Pt will improve ability to pick up items from the floor independently use AE as needed.   ACTION PLANNING - CUSTOM  Target Problem Area: Bending, reaching, stooping  Why Problem May Occur:  -fear of falling -Limited mobility     Target Goal: Pt will improve ability to pick up items from the floor independently use AE as needed.    STRATEGIES Saving Your Energy: DO: DON'T:  Use a reacher   Take your time Don't rush  Modifying your home environment and making it safe: DO: DON'T:  Use your reacher   Sit when attempting to pick items up from floor   Simplifying the way you set up tasks or daily routines: DO: DON'T:  Keep heavy items on lower shelves   Keep frequently used items on lower shelves    Practice It is important to practice the strategies so we can determine if they will be effective in helping to reach your goal. Follow these specific recommendations: Take your time when cleaning or attempting to straighten things up 2.   Use your reacher as needed 3.   Arrange cabinets for improved ease  If a strategy does not work the first time, try it again and again (and maybe again). We may make some changes over the next few sessions, based on how they work.   [name], OTR/L  [phone]          Post Clinical  Reasoning: Client Action (Goal) Two Interventions: Pt will improve ability to pick up items from the floor independently use AE as needed. Did Client Try?: Yes Targeted Problem Area Status: A Lot Better  Clinician View Of Client Situation:: CHS at the home when OT arrived, daughter with pt. Pt continues to actively brainstorm how to stay safe in her home. Is currently stressed about her sister with dementia and being the only family her sister has.  Client View Of His/Her Situation:: Pt reports her bedrail is working well, she moves it as needed. Pt is excited about the work being done at her house currently. She is being careful with her mobility and plans to use her reacher regularly.  Next Visit Plan:: Provide shower seat and grippers, assess modifications, discharge    [name], OTR/L  [phone] 09/14/22

## 2022-11-04 ENCOUNTER — Other Ambulatory Visit: Payer: Self-pay | Admitting: Occupational Therapy

## 2022-11-07 NOTE — Patient Outreach (Signed)
Aging Gracefully Program  OT FINAL Visit  11/04/2022  Jackalynn Art [birth year] [id number]  Visit:  4- Fourth Visit  Start Time:  1600 End Time:  1062 Total Minutes:  30  Readiness to Change:  Readiness to Change Score: 9.33  Home Environment Assessment:    Durable Medical Equipment: Durable Medical Equipment: Shower Chair With Back Durable Medical Equipment Distribution Date: 11/04/22 Adaptive Equipment Distribution Date: 09/14/22  Patient Education: Education Provided: Yes Education Details: Educated on Civil engineer, contracting use, Tips for Aging book and program close [employer]) Educated: Patient, Child(ren) Comprehension: Verbalized Understanding, Returned Demonstration  Goals:  Goals Addressed             This Visit's Progress    COMPLETED: Patient Stated       Pt will improve safety and independence getting into and out of shower and performing bathing tasks.   OT ACTION PLAN: Bathing  Target Problem Area:  Getting into and out of the shower and performing bathing tasks safely    Why Problem May Occur:    -limited mobility  -fear of falling  -no seat or unsafe seating  -no grab bars  -high tub sides    Target Goal(s):  To perform bathing tasks and shower transfers safely and independently     STRATEGIES   Saving Your Energy DO:  Use a tub bench/seat   Use a long-handled sponge  Keep frequently used items within easy reach   Modifying your home environment and making it safe     DO:  Install grab bars in the shower and next to the toilet  Place a rubber mat the entire length of the tub or use tub grippers  Make sure the bathroom is well-lit  (Other):Install a hand held shower head    Simplifying the way you set up tasks or daily routines DO:  Plan to bathe/shower before you're overly tired  Gather everything you will need before beginning   PRACTICE  Based on what we have talked about, you are willing to try:  1. Use a shower seat/tub bench and  sit for bathing, especially when needing to close your eyes 2. Shower when feeling good, not when overly tired 3. Use adaptive equipment as needed such as a long handled sponge, to reach difficult areas  If an idea does not work the first time, try it again (and again).  We may make some changes over the next few sessions, based on how they work.     Dana Tyler , OTR/L         11/04/22 Occupational Therapist          Date      COMPLETED: Patient Stated       Pt will improve safety getting into and out of the house via the back steps and door.     OT ACTION PLAN: Functional Mobility  Target Problem Area:   Getting in and out of the house   Why Problem May Occur:     -no rails at steps -balance issues at times    Target Goal(s):   To get into and out of the house safely and independently.     STRATEGIES   Saving Your Energy DO:  Take breaks  Use the handrail   Remove tripping hazards   Modifying your home environment and making it safe    DO:  Install handrail  Remove or strongly secure doormats  (Other): Provide adequate lighting     Simplifying the way  you set up tasks or daily routines DO:  Move slowly     PRACTICE  Based on what we have talked about, you are willing to try:  1. Install the handrail 2. Turn outdoor lights on  3. Take your time, don't try to move really fast  If a strategy does not work the first time, try it again (and again).  We may make some changes over the next few sessions, based on how they work.    Dana Tyler, OTR/L         11/04/22 Occupational Therapist          Date            Post Clinical Reasoning: Client Action (Goal) Three Interventions: Getting into and out of the shower and performing bathing tasks safely Did Client Try?: No Reason Client Did Not Try?: No Longer A Problem/Did Not Occur Targeted Problem Area Status: A Lot Better  Client Action (Goal) Four Interventions: Getting in and out of  the house Did Client Try?: Yes Targeted Problem Area Status: A Lot Better  Clinician View Of Client Situation:: All modifications completed, delivered shower seat to pt today and reviewed use. Pt is proactive with safety and action planning, has been using all strategies and is ready to bathe safely as well. Handrail at both sets of back steps assisting with safety getting into and out of the house. Reviewed Tips for Aging book with pt. Shower seat installed, tight fit with narrow tub but stable.  Client View Of His/Her Situation:: Pt reports she is very pleased with the program and all the work that [facility] completed. She is using her DME and AE effectively. Is satisfied with the fit of her shower seat as it is stable. Verbalized understanding of the remainder of the program and is excited to read her tips for aging book.  Next Visit Plan:: N/A-Discharge pt   Dana Tyler, OTR/L  [phone] 11/04/22

## 2023-02-10 ENCOUNTER — Other Ambulatory Visit: Payer: Self-pay

## 2023-02-10 NOTE — Patient Outreach (Signed)
Aging Gracefully Program  02/10/2023  Dana Tyler 1945/11/03 [id number]   [facility] Evaluation Interviewer made contact with patient. Aging Gracefully 5 month survey completed.   [name] Care Management Assistant [phone]

## 2023-07-04 ENCOUNTER — Other Ambulatory Visit: Payer: Self-pay

## 2023-07-04 NOTE — Patient Outreach (Signed)
Aging Gracefully Program  07/04/2023  Dana Tyler 13-Feb-1946 [id number]   [facility] Evaluation Interviewer attempted to call patient on today regarding Aging Gracefully referral. No answer from patient after multiple rings. CMA left confidential voicemail for patient to return call.  Will attempt to call back within 1 week.   [name] Care Management Assistant [phone]

## 2023-07-07 ENCOUNTER — Other Ambulatory Visit: Payer: Self-pay

## 2023-07-07 NOTE — Patient Outreach (Signed)
Aging Gracefully Program  07/07/2023  Dana Tyler March 19, 1946 [id number]   [facility] Evaluation Interviewer made contact with patient. Aging Gracefully 9 month survey completed.     [name] Care Management Assistant [phone]

## 2024-11-12 NOTE — Progress Notes (Signed)
 Sent message, via epic in basket, requesting orders in epic from Careers adviser.

## 2024-11-14 NOTE — H&P (Signed)
 " Patient's anticipated LOS is less than 2 midnights, meeting these requirements: - Younger than 38 - Lives within 1 hour of care - Has a competent adult at home to recover with post-op recover - NO history of  - Chronic pain requiring opiods  - Diabetes  - Coronary Artery Disease  - Heart failure  - Heart attack  - Stroke  - DVT/VTE  - Cardiac arrhythmia  - Respiratory Failure/COPD  - Renal failure  - Anemia  - Advanced Liver disease     Dana Tyler is an 79 y.o. female.    Chief Complaint: right shoulder pain  HPI: Pt is a 79 y.o. female complaining of right shoulder pain for multiple years. Pain had continually increased since the beginning. X-rays in the clinic show end-stage arthritic changes of the right shoulder. Pt has tried various conservative treatments which have failed to alleviate their symptoms, including injections and therapy. Various options are discussed with the patient. Risks, benefits and expectations were discussed with the patient. Patient understand the risks, benefits and expectations and wishes to proceed with surgery.   PCP:  Jolee Madelin Patch, MD  D/C Plans: Home  PMH: Past Medical History:  Diagnosis Date   Allergy    Arthritis    Cancer (HCC)    GERD (gastroesophageal reflux disease)    Hypertension    Overactive bladder    Vertigo     PSH: Past Surgical History:  Procedure Laterality Date   ABDOMINAL HYSTERECTOMY     HAMMER TOE SURGERY     HEMORRHOIDECTOMY WITH HEMORRHOID BANDING     NEPHRECTOMY     REPLACEMENT TOTAL KNEE      Social History:  reports that she has never smoked. She has never used smokeless tobacco. She reports that she does not drink alcohol and does not use drugs. BMI: Estimated body mass index is 36.67 kg/m as calculated from the following:   Height as of 10/24/19: 5' 3 (1.6 m).   Weight as of 10/24/19: 93.9 kg.  No results found for: ALBUMIN Diabetes: Patient does not have a diagnosis of diabetes.      Smoking Status:   reports that she has never smoked. She has never used smokeless tobacco.    Allergies:  Allergies[1]  Medications: No current facility-administered medications for this encounter.   Current Outpatient Medications  Medication Sig Dispense Refill   acetaminophen  (TYLENOL ) 650 MG CR tablet Take 650 mg by mouth every 8 (eight) hours.     ALPRAZolam (XANAX) 0.25 MG tablet Take 0.25 mg by mouth 3 (three) times daily as needed for anxiety.     amLODipine (NORVASC) 5 MG tablet Take 5 mg by mouth daily.     ascorbic acid (VITAMIN C) 500 MG tablet Take 500 mg by mouth daily.     aspirin EC 81 MG tablet Take 81 mg by mouth daily.     cholecalciferol (VITAMIN D3) 25 MCG (1000 UNIT) tablet Take 1,000 Units by mouth daily.     cyanocobalamin (VITAMIN B12) 1000 MCG tablet Take 1,000 mcg by mouth daily.     fluticasone (FLONASE) 50 MCG/ACT nasal spray Place 2 sprays into both nostrils daily as needed for allergies.     labetalol (NORMODYNE) 300 MG tablet Take 300 mg by mouth 3 (three) times daily.     lidocaine (LIDODERM) 5 % Apply 1 patch topically daily as needed (pain).     loratadine (CLARITIN) 10 MG tablet Take 10 mg by mouth daily.  montelukast (SINGULAIR) 10 MG tablet Take 10 mg by mouth at bedtime.     MULTIPLE VITAMIN PO Take 1 tablet by mouth daily.     omeprazole (PRILOSEC) 40 MG capsule Take 40 mg by mouth daily.     oxybutynin (DITROPAN) 5 MG tablet Take 5 mg by mouth 3 (three) times daily.     traMADol (ULTRAM) 50 MG tablet Take 50 mg by mouth every 6 (six) hours.      No results found for this or any previous visit (from the past 48 hours). No results found.  ROS: Pain with rom of the right upper extremity  Physical Exam: Alert and oriented 79 y.o. female in no acute distress Cranial nerves 2-12 intact Cervical spine: full rom with no tenderness, nv intact distally Chest: active breath sounds bilaterally, no wheeze rhonchi or rales Heart: regular rate  and rhythm, no murmur Abd: non tender non distended with active bowel sounds Hip is stable with rom  Right shoulder painful and weak rom Nv intact distally No rashes or edema distally  Assessment/Plan Assessment: right shoulder cuff arthropathy  Plan:  Patient will undergo a right reverse total shoulder by Dr. Kay at [facility] Risks benefits and expectations were discussed with the patient. Patient understand risks, benefits and expectations and wishes to proceed. Preoperative templating of the joint replacement has been completed, documented, and submitted to the Operating Room personnel in order to optimize intra-operative equipment management.   Arvella Fireman PA-C, MPAS [facility] is now [employer] [address], [place], [place] [address] Phone: [phone] [email or web address]  Instagram  LinkedIn  Twitter        [1]  Allergies Allergen Reactions   Oxycodone  Itching   "

## 2024-11-19 ENCOUNTER — Encounter (HOSPITAL_COMMUNITY): Admission: RE | Admit: 2024-11-19 | Source: Ambulatory Visit

## 2024-11-29 ENCOUNTER — Ambulatory Visit (HOSPITAL_COMMUNITY): Admit: 2024-11-29 | Admitting: Orthopedic Surgery

## 2024-11-29 ENCOUNTER — Encounter (HOSPITAL_COMMUNITY): Admission: RE | Payer: Self-pay | Source: Home / Self Care
